# Patient Record
Sex: Female | Born: 1965
Health system: Southern US, Community
[De-identification: ages and names within clinical notes are randomized; demographics above are authoritative.]

## PROBLEM LIST (undated history)

## (undated) DIAGNOSIS — G5700 Lesion of sciatic nerve, unspecified lower limb: Secondary | ICD-10-CM

## (undated) DIAGNOSIS — M549 Dorsalgia, unspecified: Secondary | ICD-10-CM

## (undated) HISTORY — PX: ABDOMINAL HYSTERECTOMY: SHX81

## (undated) HISTORY — PX: HERNIA REPAIR: SHX51

---

## 2004-04-09 ENCOUNTER — Observation Stay (HOSPITAL_COMMUNITY): Admission: RE | Admit: 2004-04-09 | Discharge: 2004-04-10 | Payer: Self-pay | Admitting: Gynecology

## 2005-04-21 ENCOUNTER — Emergency Department: Payer: Self-pay | Admitting: Emergency Medicine

## 2006-12-05 ENCOUNTER — Ambulatory Visit: Payer: Self-pay | Admitting: Family Medicine

## 2007-07-19 ENCOUNTER — Ambulatory Visit: Payer: Self-pay | Admitting: Family Medicine

## 2011-02-27 ENCOUNTER — Emergency Department: Payer: Self-pay | Admitting: Emergency Medicine

## 2011-03-25 ENCOUNTER — Ambulatory Visit: Payer: Self-pay | Admitting: Orthopedic Surgery

## 2015-02-06 ENCOUNTER — Other Ambulatory Visit: Payer: Self-pay | Admitting: Family Medicine

## 2015-02-06 DIAGNOSIS — Z1239 Encounter for other screening for malignant neoplasm of breast: Secondary | ICD-10-CM

## 2015-02-13 ENCOUNTER — Ambulatory Visit
Admission: RE | Admit: 2015-02-13 | Discharge: 2015-02-13 | Disposition: A | Discharge status: Home / Self Care | Payer: Commercial Managed Care - PPO | Source: Ambulatory Visit | Attending: Family Medicine | Admitting: Family Medicine

## 2015-02-13 DIAGNOSIS — Z1231 Encounter for screening mammogram for malignant neoplasm of breast: Secondary | ICD-10-CM | POA: Insufficient documentation

## 2015-02-13 DIAGNOSIS — N6489 Other specified disorders of breast: Secondary | ICD-10-CM | POA: Insufficient documentation

## 2015-02-13 DIAGNOSIS — Z1239 Encounter for other screening for malignant neoplasm of breast: Secondary | ICD-10-CM

## 2015-02-19 ENCOUNTER — Other Ambulatory Visit: Payer: Self-pay | Admitting: Family Medicine

## 2015-02-19 DIAGNOSIS — N6489 Other specified disorders of breast: Secondary | ICD-10-CM

## 2015-02-19 DIAGNOSIS — R928 Other abnormal and inconclusive findings on diagnostic imaging of breast: Secondary | ICD-10-CM

## 2015-02-19 DIAGNOSIS — Z1231 Encounter for screening mammogram for malignant neoplasm of breast: Secondary | ICD-10-CM

## 2015-02-20 ENCOUNTER — Ambulatory Visit
Admission: RE | Admit: 2015-02-20 | Discharge: 2015-02-20 | Disposition: A | Discharge status: Home / Self Care | Payer: Commercial Managed Care - PPO | Source: Ambulatory Visit | Attending: Family Medicine | Admitting: Family Medicine

## 2015-02-20 DIAGNOSIS — N6001 Solitary cyst of right breast: Secondary | ICD-10-CM | POA: Insufficient documentation

## 2015-02-20 DIAGNOSIS — R928 Other abnormal and inconclusive findings on diagnostic imaging of breast: Secondary | ICD-10-CM

## 2015-02-20 DIAGNOSIS — N6489 Other specified disorders of breast: Secondary | ICD-10-CM

## 2015-06-25 ENCOUNTER — Other Ambulatory Visit: Payer: Self-pay | Admitting: Family Medicine

## 2015-07-06 ENCOUNTER — Other Ambulatory Visit: Payer: Self-pay | Admitting: Family Medicine

## 2015-07-06 DIAGNOSIS — Z1231 Encounter for screening mammogram for malignant neoplasm of breast: Secondary | ICD-10-CM

## 2015-07-10 ENCOUNTER — Other Ambulatory Visit: Payer: Self-pay | Admitting: Family Medicine

## 2015-07-21 ENCOUNTER — Other Ambulatory Visit: Payer: Self-pay | Admitting: Family Medicine

## 2015-07-21 DIAGNOSIS — R928 Other abnormal and inconclusive findings on diagnostic imaging of breast: Secondary | ICD-10-CM

## 2015-08-25 ENCOUNTER — Ambulatory Visit: Admission: RE | Admit: 2015-08-25 | Payer: Commercial Managed Care - PPO | Source: Ambulatory Visit

## 2015-08-25 ENCOUNTER — Other Ambulatory Visit: Payer: Commercial Managed Care - PPO

## 2015-09-09 ENCOUNTER — Ambulatory Visit
Admission: RE | Admit: 2015-09-09 | Discharge: 2015-09-09 | Disposition: A | Discharge status: Home / Self Care | Payer: Commercial Managed Care - PPO | Source: Ambulatory Visit | Attending: Family Medicine | Admitting: Family Medicine

## 2015-09-09 DIAGNOSIS — R928 Other abnormal and inconclusive findings on diagnostic imaging of breast: Secondary | ICD-10-CM | POA: Diagnosis not present

## 2015-09-09 DIAGNOSIS — N6489 Other specified disorders of breast: Secondary | ICD-10-CM | POA: Diagnosis present

## 2015-12-24 ENCOUNTER — Emergency Department: Payer: No Typology Code available for payment source

## 2015-12-24 ENCOUNTER — Encounter: Payer: Self-pay | Admitting: Emergency Medicine

## 2015-12-24 ENCOUNTER — Emergency Department
Admission: EM | Admit: 2015-12-24 | Discharge: 2015-12-24 | Disposition: A | Discharge status: Home / Self Care | Payer: No Typology Code available for payment source | Attending: Emergency Medicine | Admitting: Emergency Medicine

## 2015-12-24 DIAGNOSIS — S40021A Contusion of right upper arm, initial encounter: Secondary | ICD-10-CM | POA: Diagnosis not present

## 2015-12-24 DIAGNOSIS — N289 Disorder of kidney and ureter, unspecified: Secondary | ICD-10-CM | POA: Insufficient documentation

## 2015-12-24 DIAGNOSIS — S60511A Abrasion of right hand, initial encounter: Secondary | ICD-10-CM

## 2015-12-24 DIAGNOSIS — Y92488 Other paved roadways as the place of occurrence of the external cause: Secondary | ICD-10-CM | POA: Diagnosis not present

## 2015-12-24 DIAGNOSIS — Y999 Unspecified external cause status: Secondary | ICD-10-CM | POA: Diagnosis not present

## 2015-12-24 DIAGNOSIS — S4991XA Unspecified injury of right shoulder and upper arm, initial encounter: Secondary | ICD-10-CM | POA: Diagnosis present

## 2015-12-24 DIAGNOSIS — F17210 Nicotine dependence, cigarettes, uncomplicated: Secondary | ICD-10-CM | POA: Insufficient documentation

## 2015-12-24 DIAGNOSIS — Y9389 Activity, other specified: Secondary | ICD-10-CM | POA: Diagnosis not present

## 2015-12-24 HISTORY — DX: Lesion of sciatic nerve, unspecified lower limb: G57.00

## 2015-12-24 MED ORDER — ACETAMINOPHEN 500 MG PO TABS
1000.0000 mg | ORAL_TABLET | ORAL | Status: AC
  Administered 2015-12-24: 1000 mg via ORAL
  Filled 2015-12-24: qty 2

## 2015-12-24 MED ORDER — IBUPROFEN 600 MG PO TABS
600.0000 mg | ORAL_TABLET | ORAL | Status: AC
  Administered 2015-12-24: 600 mg via ORAL
  Filled 2015-12-24: qty 1
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
## 2016-02-05 ENCOUNTER — Other Ambulatory Visit: Payer: Self-pay | Admitting: Family Medicine

## 2016-02-05 DIAGNOSIS — R928 Other abnormal and inconclusive findings on diagnostic imaging of breast: Secondary | ICD-10-CM

## 2016-04-07 ENCOUNTER — Other Ambulatory Visit: Payer: Self-pay | Admitting: Family Medicine

## 2016-04-07 ENCOUNTER — Ambulatory Visit
Admission: RE | Admit: 2016-04-07 | Discharge: 2016-04-07 | Disposition: A | Discharge status: Home / Self Care | Payer: Commercial Managed Care - PPO | Source: Ambulatory Visit | Attending: Family Medicine | Admitting: Family Medicine

## 2016-04-07 DIAGNOSIS — R928 Other abnormal and inconclusive findings on diagnostic imaging of breast: Secondary | ICD-10-CM | POA: Diagnosis present

## 2016-04-12 ENCOUNTER — Ambulatory Visit
Admission: RE | Admit: 2016-04-12 | Discharge: 2016-04-12 | Disposition: A | Discharge status: Home / Self Care | Payer: Commercial Managed Care - PPO | Source: Ambulatory Visit | Attending: Family Medicine | Admitting: Family Medicine

## 2016-04-12 DIAGNOSIS — R928 Other abnormal and inconclusive findings on diagnostic imaging of breast: Secondary | ICD-10-CM | POA: Insufficient documentation

## 2016-06-27 ENCOUNTER — Other Ambulatory Visit: Payer: Self-pay | Admitting: Internal Medicine

## 2016-06-27 DIAGNOSIS — G8929 Other chronic pain: Secondary | ICD-10-CM

## 2016-06-27 DIAGNOSIS — M5440 Lumbago with sciatica, unspecified side: Principal | ICD-10-CM

## 2016-06-29 ENCOUNTER — Ambulatory Visit
Admission: RE | Admit: 2016-06-29 | Discharge: 2016-06-29 | Disposition: A | Discharge status: Home / Self Care | Payer: Commercial Managed Care - PPO | Source: Ambulatory Visit | Attending: Internal Medicine | Admitting: Internal Medicine

## 2016-06-29 DIAGNOSIS — M1288 Other specific arthropathies, not elsewhere classified, other specified site: Secondary | ICD-10-CM | POA: Insufficient documentation

## 2016-06-29 DIAGNOSIS — G8929 Other chronic pain: Secondary | ICD-10-CM

## 2016-06-29 DIAGNOSIS — M5126 Other intervertebral disc displacement, lumbar region: Secondary | ICD-10-CM | POA: Insufficient documentation

## 2016-06-29 DIAGNOSIS — M5440 Lumbago with sciatica, unspecified side: Secondary | ICD-10-CM | POA: Insufficient documentation

## 2017-02-23 DIAGNOSIS — M5432 Sciatica, left side: Secondary | ICD-10-CM | POA: Diagnosis not present

## 2017-03-20 DIAGNOSIS — M539 Dorsopathy, unspecified: Secondary | ICD-10-CM | POA: Diagnosis not present

## 2017-03-20 DIAGNOSIS — M25552 Pain in left hip: Secondary | ICD-10-CM | POA: Diagnosis not present

## 2017-05-04 DIAGNOSIS — R7303 Prediabetes: Secondary | ICD-10-CM | POA: Diagnosis not present

## 2017-05-04 DIAGNOSIS — N951 Menopausal and female climacteric states: Secondary | ICD-10-CM | POA: Diagnosis not present

## 2017-05-04 DIAGNOSIS — R5383 Other fatigue: Secondary | ICD-10-CM | POA: Diagnosis not present

## 2017-05-05 DIAGNOSIS — M5136 Other intervertebral disc degeneration, lumbar region: Secondary | ICD-10-CM | POA: Diagnosis not present

## 2017-05-05 DIAGNOSIS — M5416 Radiculopathy, lumbar region: Secondary | ICD-10-CM | POA: Diagnosis not present

## 2017-06-09 DIAGNOSIS — M5136 Other intervertebral disc degeneration, lumbar region: Secondary | ICD-10-CM | POA: Diagnosis not present

## 2017-06-09 DIAGNOSIS — M5416 Radiculopathy, lumbar region: Secondary | ICD-10-CM | POA: Diagnosis not present

## 2017-09-04 IMAGING — MR MR LUMBAR SPINE W/O CM
4 of 5 series · 15 of 48 positions shown · non-contrast
Comparison: None.

CLINICAL DATA: Low back pain. Numbness and tingling in LEFT leg.
Previous lifting injury.

EXAM:
MRI LUMBAR SPINE WITHOUT CONTRAST
TECHNIQUE: Multiplanar, multisequence MR imaging of the lumbar spine was
performed. No intravenous contrast was administered.

[Series 3: T2 · sagittal · 4.0mm · 0.44mm/px · 6 of 15 slices shown (1 of 2)]
[im 1/15]
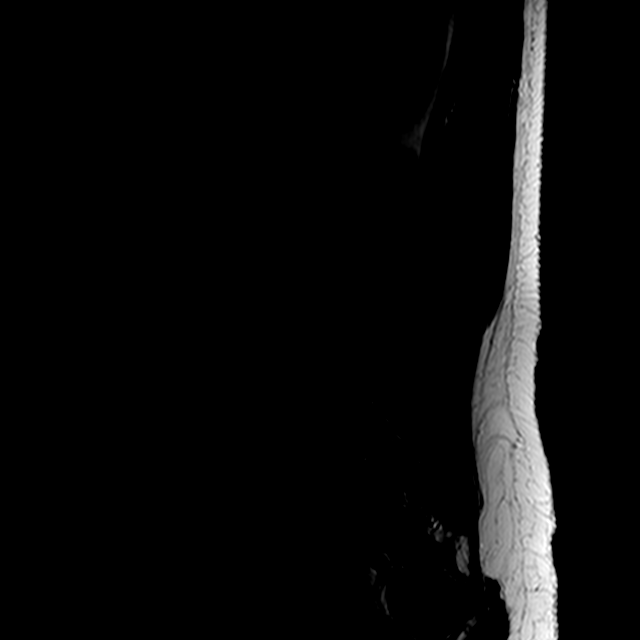
[im 3/15]
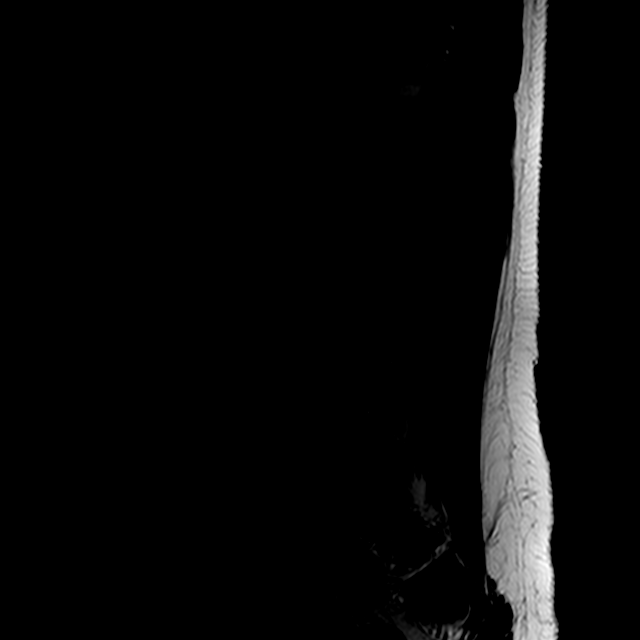
[im 6/15]
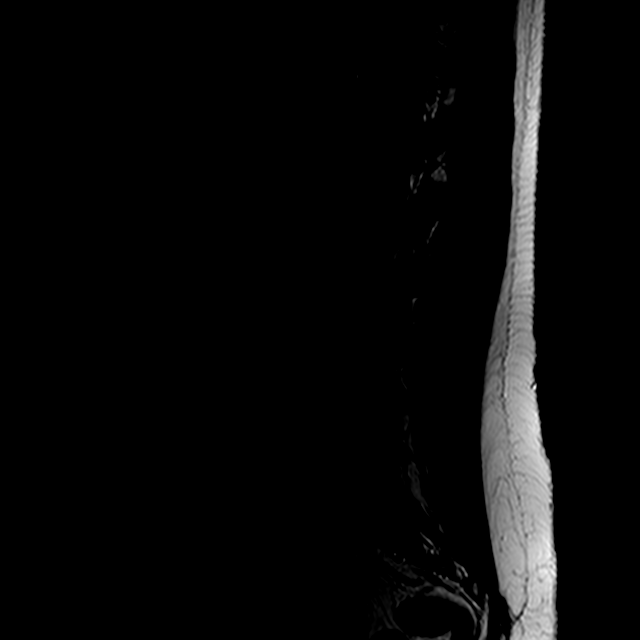
[im 9/15]
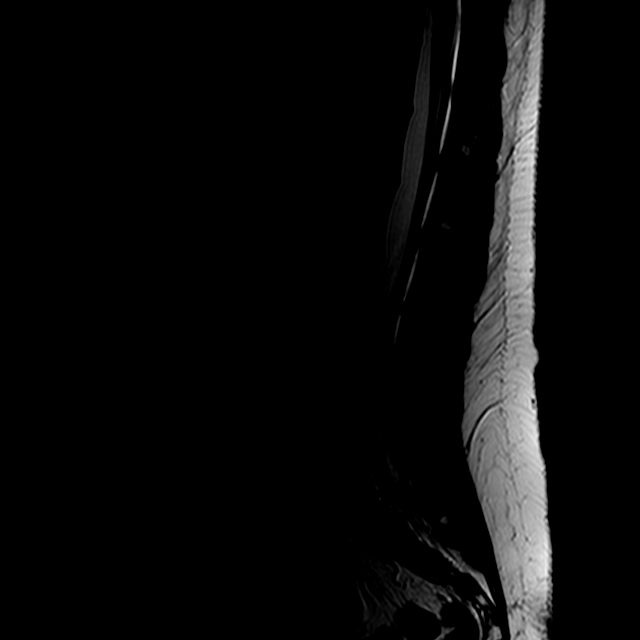
[im 12/15]
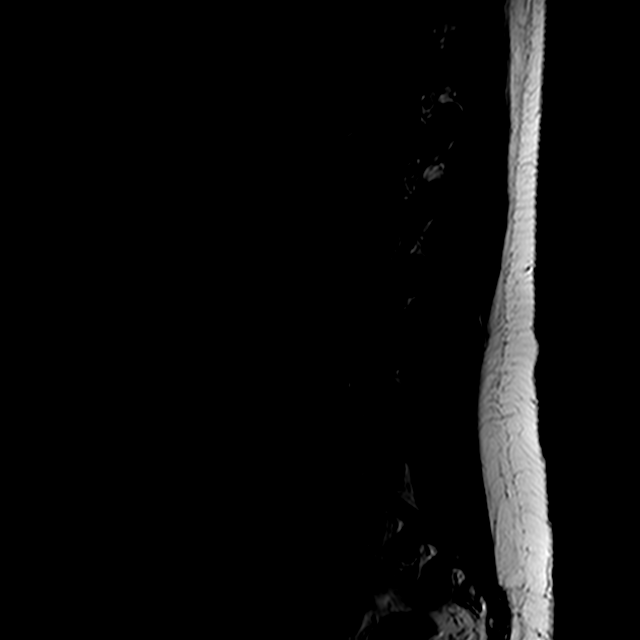
[im 15/15]
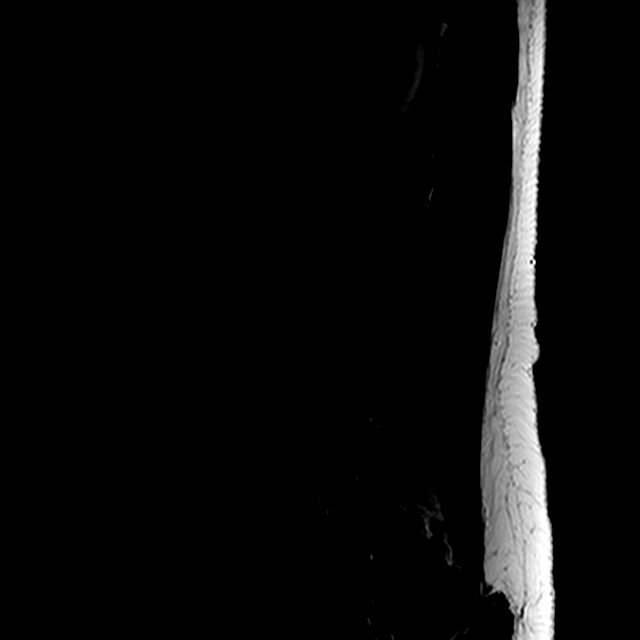

[Series 4: T1 · sagittal · 4.0mm · 0.44mm/px · 3 of 15 slices shown (1 of 2)]
[im 3/15]
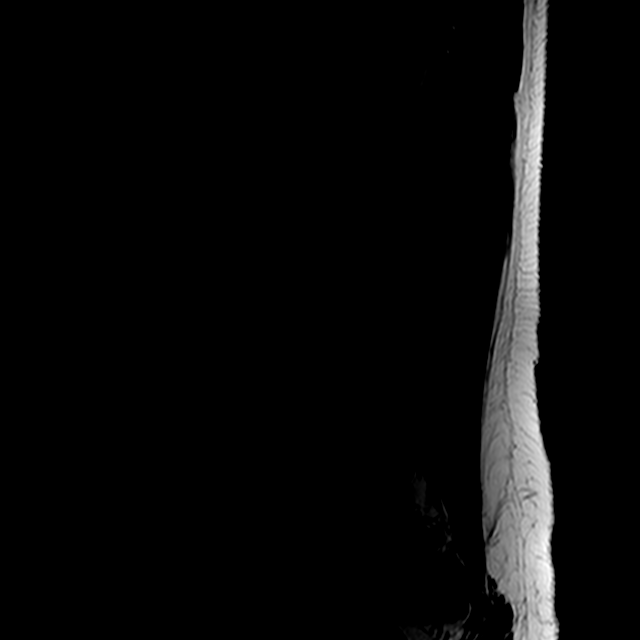
[im 8/15]
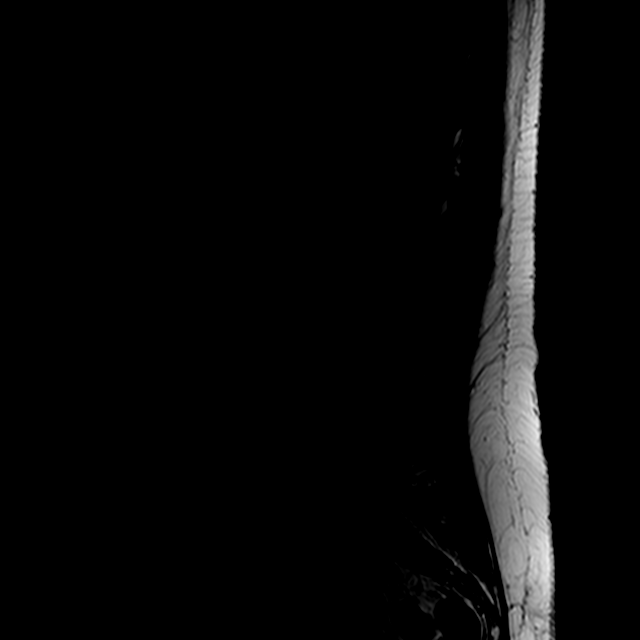
[im 12/15]
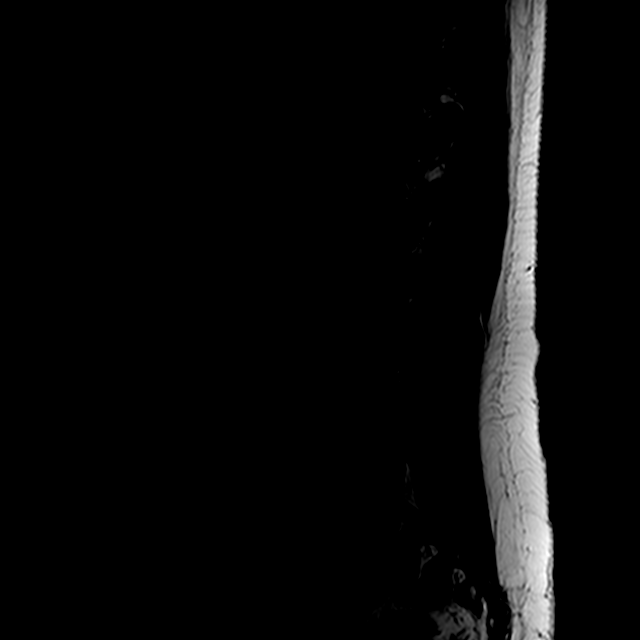

[Series 6: T2 · axial · 4.0mm · 0.39mm/px · z∈[-75,+61]mm · 3 of 31 slices shown (2 of 2)]
[im 5/31]
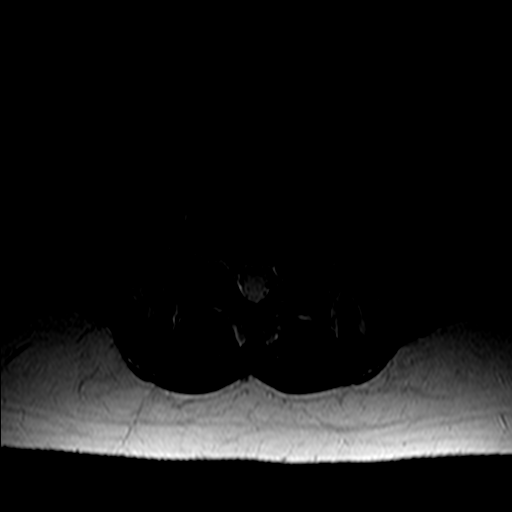
[im 17/31]
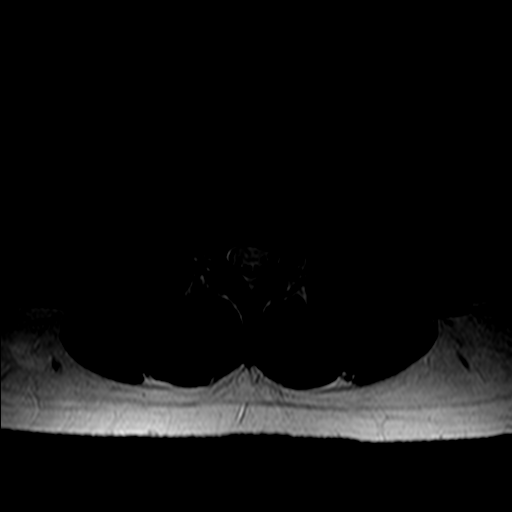
[im 26/31]
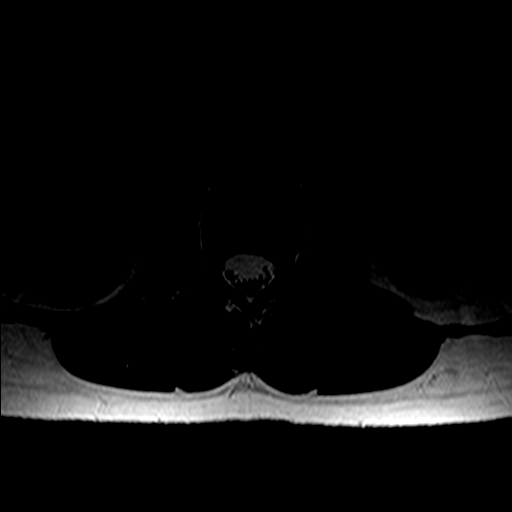

[Series 7: T1 · axial · 4.0mm · 0.39mm/px · z∈[-75,+61]mm · 3 of 31 slices shown (2 of 2)]
[im 5/31]
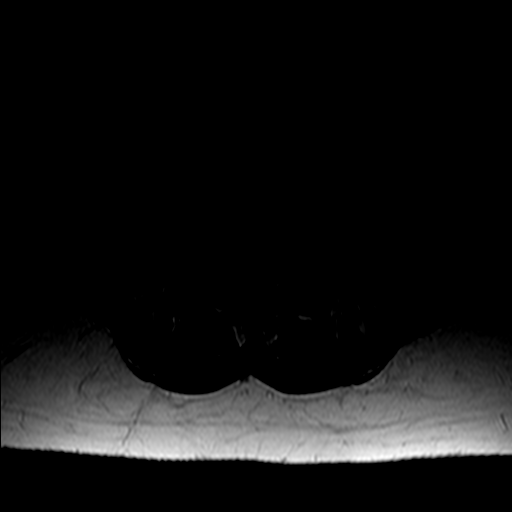
[im 17/31]
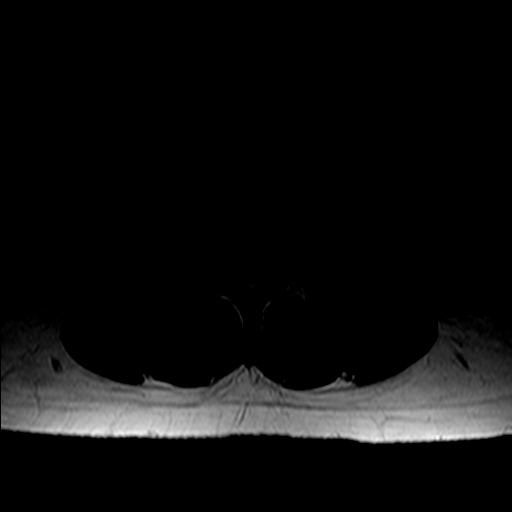
[im 26/31]
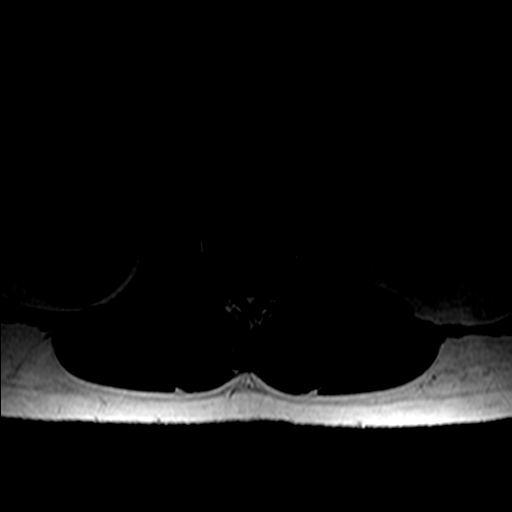

[15 of 48 positions shown; findings below may reference images not displayed]

FINDINGS: Segmentation:  Standard.

Alignment:  Physiologic.

Vertebrae: No fracture or evidence of discitis. No focal bone
lesion. Low signal intensity T1 and T2 marrow signal correlates with
smoking.

Conus medullaris: Extends to the L1 level and appears normal.

Paraspinal and other soft tissues: Negative

Disc levels:

L1-L2:  Normal.

L2-L3: Far-lateral) protrusion, with annular rent. Mild facet
arthropathy. LEFT L2 nerve root impingement.

L3-L4: Far lateral and foraminal protrusion on the LEFT. Mild facet
arthropathy. LEFT L3 nerve root impingement.

L4-L5:  Annular bulge.  Facet arthropathy.  No impingement.

L5-S1:  Normal.

Compared with priors, disc pathology at L2-3 and L3-4 is new.
IMPRESSION: Far lateral and foraminal protrusion at L2-3 and L3-4, both on the
LEFT. These could contribute to the patient's symptomatology. Both
appear new from 1661.

No significant spinal stenosis or conus compression.

Low signal intensity bone marrow correlates with smoking.

## 2018-05-03 DIAGNOSIS — H5213 Myopia, bilateral: Secondary | ICD-10-CM | POA: Diagnosis not present

## 2018-05-26 ENCOUNTER — Encounter: Payer: Self-pay | Admitting: Emergency Medicine

## 2018-05-26 ENCOUNTER — Emergency Department
Admission: EM | Admit: 2018-05-26 | Discharge: 2018-05-26 | Disposition: A | Discharge status: Home / Self Care | Payer: 59 | Attending: Emergency Medicine | Admitting: Emergency Medicine

## 2018-05-26 DIAGNOSIS — R21 Rash and other nonspecific skin eruption: Secondary | ICD-10-CM | POA: Insufficient documentation

## 2018-05-26 DIAGNOSIS — F17210 Nicotine dependence, cigarettes, uncomplicated: Secondary | ICD-10-CM | POA: Diagnosis not present

## 2018-05-26 MED ORDER — TRIAMCINOLONE ACETONIDE 0.5 % EX OINT: 1.0000 "application " | TOPICAL_OINTMENT | Freq: Two times a day (BID) | CUTANEOUS | 0 refills | Status: AC

## 2018-05-26 NOTE — ED Triage Notes (Signed)
Pt arrived with concerns over rash to left arm/hand. Small red raised bumps visualized.

## 2018-05-26 NOTE — Discharge Instructions (Signed)
Follow-up with your primary care provider if any continued problems.  Begin using the ointment that was prescribed for you.  Follow-up with the dermatologist listed on your discharge papers if any worsening of your symptoms.

## 2018-05-26 NOTE — ED Provider Notes (Signed)
[facility] Emergency Department Provider Note  ____________________________________________   None    (approximate)  I have reviewed the triage vital signs and the nursing notes.   HISTORY  Chief Complaint Rash   HPI Tammie Mccoy is a 52 y.o. female presents to the ED with complaint of rash that she noticed at lunchtime.  She has several small red papules to the upper extremities and limited below the elbow bilaterally.  Areas are not itching.  There is been no warmth or drainage from the areas.  Patient denies any other areas involved on her body.  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  Past Medical History:  Diagnosis Date  . Sciatic neuropathy     There are no active problems to display for this patient.   History reviewed. No pertinent surgical history.  Prior to Admission medications   Medication Sig Start Date End Date Taking? Authorizing Provider  triamcinolone ointment (KENALOG) 0.5 % Apply 1 application topically 2 (two) times daily. 05/26/18   Tommi Rumps, PA-C    Allergies Patient has no known allergies.  Family History  Problem Relation Age of Onset  . Breast cancer Neg Hx     Social History Social History   Tobacco Use  . Smoking status: Current Every Day Smoker    Packs/day: 0.25    Types: Cigarettes  Substance Use Topics  . Alcohol use: Yes  . Drug use: Not on file    Review of Systems Constitutional: No fever/chills Cardiovascular: Denies chest pain. Respiratory: Denies shortness of breath. Musculoskeletal: Negative for back pain. Skin: Positive for rash. Neurological: Negative for headaches, focal weakness or numbness. ___________________________________________   PHYSICAL EXAM:  VITAL SIGNS: ED Triage Vitals  Enc Vitals Group     BP 05/26/18 1522 138/81     Pulse Rate 05/26/18 1522 86     Resp 05/26/18 1522 16     Temp 05/26/18 1522 98.3 F  (36.8 C)     Temp Source 05/26/18 1522 Oral     SpO2 05/26/18 1522 99 %     Weight 05/26/18 1523 138 lb (62.6 kg)     Height 05/26/18 1523 5\' 2"  (1.575 m)     Head Circumference --      Peak Flow --      Pain Score 05/26/18 1523 0     Pain Loc --      Pain Edu? --      Excl. in GC? --    Constitutional: Alert and oriented. Well appearing and in no acute distress. Eyes: Conjunctivae are normal.  Head: Atraumatic. Neck: No stridor.   Cardiovascular: Normal rate, regular rhythm. Grossly normal heart sounds.  Good peripheral circulation. Respiratory: Normal respiratory effort.  No retractions. Lungs CTAB. Musculoskeletal: His upper and lower extremities that any difficulty and normal gait was noted. Neurologic:  Normal speech and language. No gross focal neurologic deficits are appreciated.  Skin:  Skin is warm, dry.  There are 3-4 individual red papular lesions noted to each forearm without any drainage or pain to palpation.  One single area is noted midforearm.  No other rash or similar lesions is noted to the torso or lower extremities. Psychiatric: Mood and affect are normal. Speech and behavior are normal.  ____________________________________________   LABS (all labs ordered are listed, but only abnormal results are displayed)  Labs Reviewed - No data to display  PROCEDURES  Procedure(s) performed:  None  Procedures  Critical Care performed: No  ____________________________________________   INITIAL IMPRESSION / ASSESSMENT AND PLAN / ED COURSE  As part of my medical decision making, I reviewed the following data within the electronic MEDICAL RECORD NUMBER Notes from prior ED visits and [place] Controlled Substance Database  Patient presents to the ED with complaint of rash that began while she was at work.  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  She denies any itching to the area.  She has not placed any over-the-counter medication to it.  These appear to be  individual areas which could be insect bites.  Patient was given Kenalog cream to apply to the area twice daily.  She is to follow-up with her PCP if any continued problems or aloe up with [facility] if any worsening or continued problems. ____________________________________________   FINAL CLINICAL IMPRESSION(S) / ED DIAGNOSES  Final diagnoses:  Rash and nonspecific skin eruption     ED Discharge Orders         Ordered    triamcinolone ointment (KENALOG) 0.5 %  2 times daily     05/26/18 1625           Note:  This document was prepared using Dragon voice recognition software and may include unintentional dictation errors.    Tommi Rumps, PA-C 05/26/18 1702    Phineas Semen, MD 05/26/18 1816

## 2018-12-24 DIAGNOSIS — M65332 Trigger finger, left middle finger: Secondary | ICD-10-CM | POA: Diagnosis not present

## 2019-05-14 MED FILL — GABAPENTIN 300 MG CAPSULE: 300 | 30 days supply | Qty: 60 | Fill #0

## 2019-05-17 DIAGNOSIS — Z1231 Encounter for screening mammogram for malignant neoplasm of breast: Secondary | ICD-10-CM | POA: Diagnosis not present

## 2019-05-17 DIAGNOSIS — Z113 Encounter for screening for infections with a predominantly sexual mode of transmission: Secondary | ICD-10-CM | POA: Diagnosis not present

## 2019-05-17 DIAGNOSIS — Z1211 Encounter for screening for malignant neoplasm of colon: Secondary | ICD-10-CM | POA: Diagnosis not present

## 2019-05-17 DIAGNOSIS — R7303 Prediabetes: Secondary | ICD-10-CM | POA: Diagnosis not present

## 2019-05-17 DIAGNOSIS — Z1389 Encounter for screening for other disorder: Secondary | ICD-10-CM | POA: Diagnosis not present

## 2019-05-17 DIAGNOSIS — F17210 Nicotine dependence, cigarettes, uncomplicated: Secondary | ICD-10-CM | POA: Diagnosis not present

## 2019-05-28 ENCOUNTER — Other Ambulatory Visit (HOSPITAL_COMMUNITY): Payer: Self-pay | Admitting: Family Medicine

## 2019-05-28 DIAGNOSIS — IMO0002 Reserved for concepts with insufficient information to code with codable children: Secondary | ICD-10-CM

## 2020-04-09 DIAGNOSIS — M5442 Lumbago with sciatica, left side: Secondary | ICD-10-CM | POA: Diagnosis not present

## 2020-04-09 DIAGNOSIS — R928 Other abnormal and inconclusive findings on diagnostic imaging of breast: Secondary | ICD-10-CM | POA: Diagnosis not present

## 2020-04-09 DIAGNOSIS — E785 Hyperlipidemia, unspecified: Secondary | ICD-10-CM | POA: Diagnosis not present

## 2020-04-09 DIAGNOSIS — R7303 Prediabetes: Secondary | ICD-10-CM | POA: Diagnosis not present

## 2020-04-09 DIAGNOSIS — F4320 Adjustment disorder, unspecified: Secondary | ICD-10-CM | POA: Diagnosis not present

## 2020-04-23 ENCOUNTER — Telehealth (INDEPENDENT_AMBULATORY_CARE_PROVIDER_SITE_OTHER): Payer: Self-pay | Admitting: Gastroenterology

## 2020-04-23 ENCOUNTER — Other Ambulatory Visit: Payer: Self-pay

## 2020-04-23 DIAGNOSIS — Z1211 Encounter for screening for malignant neoplasm of colon: Secondary | ICD-10-CM

## 2020-04-23 MED ORDER — NA SULFATE-K SULFATE-MG SULF 17.5-3.13-1.6 GM/177ML PO SOLN: 1.0000 | Freq: Once | ORAL | 0 refills | Status: AC

## 2020-04-23 NOTE — Progress Notes (Signed)
Gastroenterology Pre-Procedure Review  Request Date: Friday 05/01/20 Requesting Physician: Dr. Tobi Bastos  PATIENT REVIEW QUESTIONS: The patient responded to the following health history questions as indicated:    1. Are you having any GI issues? no 2. Do you have a personal history of Polyps? no 3. Do you have a family history of Colon Cancer or Polyps? no 4. Diabetes Mellitus? no 5. Joint replacements in the past 12 months?no 6. Major health problems in the past 3 months?no 7. Any artificial heart valves, MVP, or defibrillator?no    MEDICATIONS & ALLERGIES:    Patient reports the following regarding taking any anticoagulation/antiplatelet therapy:   Plavix, Coumadin, Eliquis, Xarelto, Lovenox, Pradaxa, Brilinta, or Effient? no Aspirin? no  Patient confirms/reports the following medications:  Current Outpatient Medications  Medication Sig Dispense Refill  . gabapentin (NEURONTIN) 300 MG capsule Take by mouth.    Marland Kitchen ibuprofen (ADVIL) 200 MG tablet Take by mouth.    . meloxicam (MOBIC) 15 MG tablet Take 15 mg by mouth daily as needed.    . mirtazapine (REMERON) 15 MG tablet Take by mouth.    . triamcinolone ointment (KENALOG) 0.5 % Apply 1 application topically 2 (two) times daily. 15 g 0   No current facility-administered medications for this visit.    Patient confirms/reports the following allergies:  No Known Allergies  No orders of the defined types were placed in this encounter.   AUTHORIZATION INFORMATION Primary Insurance: 1D#: Group #:  Secondary Insurance: 1D#: Group #:  SCHEDULE INFORMATION: Date: Friday 05/01/20 Time: Location:[facility]

## 2020-04-29 ENCOUNTER — Other Ambulatory Visit
Admission: RE | Admit: 2020-04-29 | Discharge: 2020-04-29 | Disposition: A | Discharge status: Home / Self Care | Payer: 59 | Source: Ambulatory Visit | Attending: Gastroenterology | Admitting: Gastroenterology

## 2020-04-29 ENCOUNTER — Other Ambulatory Visit: Payer: Self-pay

## 2020-04-29 DIAGNOSIS — Z01812 Encounter for preprocedural laboratory examination: Secondary | ICD-10-CM | POA: Insufficient documentation

## 2020-04-29 DIAGNOSIS — Z20822 Contact with and (suspected) exposure to covid-19: Secondary | ICD-10-CM | POA: Diagnosis not present

## 2020-04-29 LAB — SARS CORONAVIRUS 2 (TAT 6-24 HRS): SARS Coronavirus 2: NEGATIVE

## 2020-05-01 ENCOUNTER — Ambulatory Visit: Payer: 59 | Admitting: Certified Registered"

## 2020-05-01 ENCOUNTER — Encounter
Admission: RE | Disposition: A | Discharge status: Home / Self Care | Payer: Self-pay | Source: Ambulatory Visit | Attending: Gastroenterology

## 2020-05-01 ENCOUNTER — Ambulatory Visit
Admission: RE | Admit: 2020-05-01 | Discharge: 2020-05-01 | Disposition: A | Discharge status: Home / Self Care | Payer: 59 | Source: Ambulatory Visit | Attending: Gastroenterology | Admitting: Gastroenterology

## 2020-05-01 ENCOUNTER — Encounter: Payer: Self-pay | Admitting: Gastroenterology

## 2020-05-01 DIAGNOSIS — K621 Rectal polyp: Secondary | ICD-10-CM | POA: Insufficient documentation

## 2020-05-01 DIAGNOSIS — Z791 Long term (current) use of non-steroidal anti-inflammatories (NSAID): Secondary | ICD-10-CM | POA: Insufficient documentation

## 2020-05-01 DIAGNOSIS — K5730 Diverticulosis of large intestine without perforation or abscess without bleeding: Secondary | ICD-10-CM | POA: Insufficient documentation

## 2020-05-01 DIAGNOSIS — F17210 Nicotine dependence, cigarettes, uncomplicated: Secondary | ICD-10-CM | POA: Insufficient documentation

## 2020-05-01 DIAGNOSIS — Z7689 Persons encountering health services in other specified circumstances: Secondary | ICD-10-CM | POA: Diagnosis not present

## 2020-05-01 DIAGNOSIS — K5140 Inflammatory polyps of colon without complications: Secondary | ICD-10-CM | POA: Diagnosis not present

## 2020-05-01 DIAGNOSIS — K5790 Diverticulosis of intestine, part unspecified, without perforation or abscess without bleeding: Secondary | ICD-10-CM | POA: Diagnosis not present

## 2020-05-01 DIAGNOSIS — Z1211 Encounter for screening for malignant neoplasm of colon: Secondary | ICD-10-CM | POA: Diagnosis not present

## 2020-05-01 DIAGNOSIS — Z79899 Other long term (current) drug therapy: Secondary | ICD-10-CM | POA: Diagnosis not present

## 2020-05-01 DIAGNOSIS — K635 Polyp of colon: Secondary | ICD-10-CM | POA: Diagnosis not present

## 2020-05-01 HISTORY — PX: COLONOSCOPY WITH PROPOFOL: SHX5780

## 2020-05-01 HISTORY — DX: Dorsalgia, unspecified: M54.9

## 2020-05-01 SURGERY — COLONOSCOPY WITH PROPOFOL
Anesthesia: General

## 2020-05-01 MED ORDER — PROPOFOL 500 MG/50ML IV EMUL
INTRAVENOUS | Status: DC | PRN
  Administered 2020-05-01: 165 ug/kg/min via INTRAVENOUS

## 2020-05-01 MED ORDER — SODIUM CHLORIDE 0.9 % IV SOLN: INTRAVENOUS | Status: DC

## 2020-05-01 MED ORDER — PROPOFOL 10 MG/ML IV BOLUS
INTRAVENOUS | Status: DC | PRN
  Administered 2020-05-01: 10 mg via INTRAVENOUS
  Administered 2020-05-01: 55 mg via INTRAVENOUS

## 2020-05-01 MED ORDER — LIDOCAINE HCL (CARDIAC) PF 100 MG/5ML IV SOSY
PREFILLED_SYRINGE | INTRAVENOUS | Status: DC | PRN
  Administered 2020-05-01: 100 mg via INTRAVENOUS

## 2020-05-01 NOTE — Anesthesia Procedure Notes (Signed)
Procedure Name: General with mask airway Performed by: Fletcher-Harrison, Elfie Costanza, CRNA Pre-anesthesia Checklist: Patient identified, Emergency Drugs available, Suction available and Patient being monitored Patient Re-evaluated:Patient Re-evaluated prior to induction Oxygen Delivery Method: Simple face mask Induction Type: IV induction Placement Confirmation: positive ETCO2 and CO2 detector Dental Injury: Teeth and Oropharynx as per pre-operative assessment        

## 2020-05-01 NOTE — Anesthesia Postprocedure Evaluation (Signed)
Anesthesia Post Note  Patient: Tammie Mccoy  Procedure(s) Performed: COLONOSCOPY WITH PROPOFOL (N/A )  Patient location during evaluation: Endoscopy Anesthesia Type: General Level of consciousness: awake and alert Pain management: pain level controlled Vital Signs Assessment: post-procedure vital signs reviewed and stable Respiratory status: spontaneous breathing and respiratory function stable Cardiovascular status: stable Anesthetic complications: no   No complications documented.   Last Vitals:  Vitals:   05/01/20 0950 05/01/20 0953  BP: 126/88 126/88  Pulse: 98 94  Resp: (!) 22 20  Temp:    SpO2: 99% 100%    Last Pain:  Vitals:   05/01/20 0902  TempSrc: Temporal                 Rewa Weissberg K

## 2020-05-01 NOTE — Anesthesia Preprocedure Evaluation (Signed)
Anesthesia Evaluation  Patient identified by MRN, date of birth, ID band Patient awake    Reviewed: Allergy & Precautions, NPO status , Patient's Chart, lab work & pertinent test results  History of Anesthesia Complications Negative for: history of anesthetic complications  Airway Mallampati: II       Dental   Pulmonary neg sleep apnea, neg COPD, Current Smoker and Patient abstained from smoking.,           Cardiovascular (-) hypertension(-) Past MI and (-) CHF (-) dysrhythmias (-) Valvular Problems/Murmurs     Neuro/Psych neg Seizures    GI/Hepatic Neg liver ROS, neg GERD  ,  Endo/Other  neg diabetes  Renal/GU negative Renal ROS     Musculoskeletal   Abdominal   Peds  Hematology   Anesthesia Other Findings   Reproductive/Obstetrics                             Anesthesia Physical Anesthesia Plan  ASA: II  Anesthesia Plan: General   Post-op Pain Management:    Induction: Intravenous  PONV Risk Score and Plan: 2 and Propofol infusion and TIVA  Airway Management Planned: Nasal Cannula  Additional Equipment:   Intra-op Plan:   Post-operative Plan:   Informed Consent: I have reviewed the patients History and Physical, chart, labs and discussed the procedure including the risks, benefits and alternatives for the proposed anesthesia with the patient or authorized representative who has indicated his/her understanding and acceptance.       Plan Discussed with:   Anesthesia Plan Comments:         Anesthesia Quick Evaluation

## 2020-05-01 NOTE — H&P (Signed)
Wyline Mood, MD [address], [place], [place], [address] [address], [place], [place], [address] Phone: [phone]  Fax: [phone]  Primary Care Physician:  [facility]   Pre-Procedure History & Physical: HPI:  Tammie Mccoy is a 54 y.o. female is here for an colonoscopy.   Past Medical History:  Diagnosis Date  . Sciatic neuropathy     No past surgical history on file.  Prior to Admission medications   Medication Sig Start Date End Date Taking? Authorizing Provider  gabapentin (NEURONTIN) 300 MG capsule Take by mouth. 02/07/20   [provider]  ibuprofen (ADVIL) 200 MG tablet Take by mouth.    [provider]  meloxicam (MOBIC) 15 MG tablet Take 15 mg by mouth daily as needed. 04/09/20   [provider]  mirtazapine (REMERON) 15 MG tablet Take by mouth. 04/10/20   [provider]  triamcinolone ointment (KENALOG) 0.5 % Apply 1 application topically 2 (two) times daily. 05/26/18   Tommi Rumps, PA-C    Allergies as of 04/23/2020  . (No Known Allergies)    Family History  Problem Relation Age of Onset  . Breast cancer Neg Hx     Social History   Socioeconomic History  . Marital status: Married    Spouse name: Not on file  . Number of children: Not on file  . Years of education: Not on file  . Highest education level: Not on file  Occupational History  . Not on file  Tobacco Use  . Smoking status: Current Every Day Smoker    Packs/day: 0.25    Types: Cigarettes  Substance and Sexual Activity  . Alcohol use: Yes  . Drug use: Not on file  . Sexual activity: Not on file  Other Topics Concern  . Not on file  Social History Narrative  . Not on file   Social Determinants of Health   Financial Resource Strain:   . Difficulty of Paying Living Expenses: Not on file  Food Insecurity:   . Worried About Programme researcher, broadcasting/film/video in the Last Year: Not on file  . Ran Out of Food  in the Last Year: Not on file  Transportation Needs:   . Lack of Transportation (Medical): Not on file  . Lack of Transportation (Non-Medical): Not on file  Physical Activity:   . Days of Exercise per Week: Not on file  . Minutes of Exercise per Session: Not on file  Stress:   . Feeling of Stress : Not on file  Social Connections:   . Frequency of Communication with Friends and Family: Not on file  . Frequency of Social Gatherings with Friends and Family: Not on file  . Attends Religious Services: Not on file  . Active Member of Clubs or Organizations: Not on file  . Attends Banker Meetings: Not on file  . Marital Status: Not on file  Intimate Partner Violence:   . Fear of Current or Ex-Partner: Not on file  . Emotionally Abused: Not on file  . Physically Abused: Not on file  . Sexually Abused: Not on file    Review of Systems: See HPI, otherwise negative ROS  Physical Exam: There were no vitals taken for this visit. General:   Alert,  pleasant and cooperative in NAD Head:  Normocephalic and atraumatic. Neck:  Supple; no masses or thyromegaly. Lungs:  Clear throughout to auscultation, normal respiratory effort.    Heart:  +  S1, +S2, Regular rate and rhythm, No edema. Abdomen:  Soft, nontender and nondistended. Normal bowel sounds, without guarding, and without rebound.   Neurologic:  Alert and  oriented x4;  grossly normal neurologically.  Impression/Plan: Tammie Mccoy is here for an colonoscopy to be performed for Screening colonoscopy average risk   Risks, benefits, limitations, and alternatives regarding  colonoscopy have been reviewed with the patient.  Questions have been answered.  All parties agreeable.   Wyline Mood, MD  05/01/2020, 8:57 AM

## 2020-05-01 NOTE — Op Note (Signed)
[facility] Gastroenterology Patient Name: Tammie Mccoy Procedure Date: 05/01/2020 9:19 AM MRN: [id number] Account #: [id number] Date of Birth: [birth year] Admit Type: Outpatient Age: 54 Room: [facility] ENDO ROOM 4 Gender: Female Note Status: Finalized Procedure:             Colonoscopy Indications:           Screening for colorectal malignant neoplasm Providers:             Wyline Mood MD, MD Referring MD:          No Local Md, MD (Referring MD) Medicines:             Monitored Anesthesia Care Complications:         No immediate complications. Procedure:             Pre-Anesthesia Assessment:                        - Prior to the procedure, a History and Physical was                         performed, and patient medications, allergies and                         sensitivities were reviewed. The patient's tolerance                         of previous anesthesia was reviewed.                        - The risks and benefits of the procedure and the                         sedation options and risks were discussed with the                         patient. All questions were answered and informed                         consent was obtained.                        - ASA Grade Assessment: II - A patient with mild                         systemic disease.                        After obtaining informed consent, the colonoscope was                         passed under direct vision. Throughout the procedure,                         the patient's blood pressure, pulse, and oxygen                         saturations were monitored continuously. The                         Colonoscope was introduced through the anus  and                         advanced to the the cecum, identified by the                         appendiceal orifice. The colonoscopy was performed                         with ease. The patient tolerated the procedure well.                         The quality of  the bowel preparation was excellent. Findings:      The perianal and digital rectal examinations were normal.      Multiple small-mouthed diverticula were found in the sigmoid colon.      Two sessile polyps were found in the rectum and proximal descending       colon. The polyps were 5 to 7 mm in size. These polyps were removed with       a cold snare. Resection and retrieval were complete.      The exam was otherwise without abnormality on direct and retroflexion       views. Impression:            - Diverticulosis in the sigmoid colon.                        - Two 5 to 7 mm polyps in the rectum and in the                         proximal descending colon, removed with a cold snare.                         Resected and retrieved.                        - The examination was otherwise normal on direct and                         retroflexion views. Recommendation:        - Discharge patient to home (with escort).                        - Resume previous diet.                        - Continue present medications.                        - Await pathology results.                        - Repeat colonoscopy for surveillance based on                         pathology results. Procedure Code(s):     --- Professional ---                        [phone], Colonoscopy, flexible; with removal of  tumor(s), polyp(s), or other lesion(s) by snare                         technique Diagnosis Code(s):     --- Professional ---                        Z12.11, Encounter for screening for malignant neoplasm                         of colon                        K62.1, Rectal polyp                        K63.5, Polyp of colon                        K57.30, Diverticulosis of large intestine without                         perforation or abscess without bleeding CPT copyright 2019 American Medical Association. All rights reserved. The codes documented in this report are preliminary and  upon coder review may  be revised to meet current compliance requirements. Wyline Mood, MD Wyline Mood MD, MD 05/01/2020 9:42:01 AM This report has been signed electronically. Number of Addenda: 0 Note Initiated On: 05/01/2020 9:19 AM Scope Withdrawal Time: 0 hours 10 minutes 46 seconds  Total Procedure Duration: 0 hours 15 minutes 7 seconds  Estimated Blood Loss:  Estimated blood loss: none.      [facility]

## 2020-05-01 NOTE — Transfer of Care (Signed)
Immediate Anesthesia Transfer of Care Note  Patient: Tammie Mccoy  Procedure(s) Performed: COLONOSCOPY WITH PROPOFOL (N/A )  Patient Location: Endoscopy Unit  Anesthesia Type:General  Level of Consciousness: drowsy and responds to stimulation  Airway & Oxygen Therapy: Patient Spontanous Breathing  Post-op Assessment: Report given to RN and Post -op Vital signs reviewed and stable  Post vital signs: Reviewed and stable  Last Vitals:  Vitals Value Taken Time  BP 111/72 05/01/20 0941  Temp    Pulse 106 05/01/20 0941  Resp 20 05/01/20 0941  SpO2 100 % 05/01/20 0941  Vitals shown include unvalidated device data.  Last Pain:  Vitals:   05/01/20 0902  TempSrc: Temporal         Complications: No complications documented.

## 2020-05-03 ENCOUNTER — Encounter: Payer: Self-pay | Admitting: Gastroenterology

## 2020-05-05 ENCOUNTER — Encounter: Payer: Self-pay | Admitting: Gastroenterology

## 2020-05-05 LAB — SURGICAL PATHOLOGY

## 2020-05-21 DIAGNOSIS — R7303 Prediabetes: Secondary | ICD-10-CM | POA: Diagnosis not present

## 2020-05-21 DIAGNOSIS — R202 Paresthesia of skin: Secondary | ICD-10-CM | POA: Diagnosis not present

## 2020-05-21 DIAGNOSIS — E785 Hyperlipidemia, unspecified: Secondary | ICD-10-CM | POA: Diagnosis not present

## 2020-05-21 DIAGNOSIS — Z1389 Encounter for screening for other disorder: Secondary | ICD-10-CM | POA: Diagnosis not present

## 2020-05-21 DIAGNOSIS — Z23 Encounter for immunization: Secondary | ICD-10-CM | POA: Diagnosis not present

## 2020-05-29 ENCOUNTER — Other Ambulatory Visit: Payer: Self-pay | Admitting: Family Medicine

## 2020-07-12 DIAGNOSIS — M109 Gout, unspecified: Secondary | ICD-10-CM | POA: Diagnosis not present

## 2020-07-12 DIAGNOSIS — M7989 Other specified soft tissue disorders: Secondary | ICD-10-CM | POA: Diagnosis not present

## 2020-07-12 DIAGNOSIS — M79645 Pain in left finger(s): Secondary | ICD-10-CM | POA: Diagnosis not present

## 2020-11-11 ENCOUNTER — Other Ambulatory Visit: Payer: Self-pay | Admitting: Student in an Organized Health Care Education/Training Program

## 2021-01-11 ENCOUNTER — Other Ambulatory Visit: Payer: Self-pay

## 2021-01-14 ENCOUNTER — Other Ambulatory Visit: Payer: Self-pay

## 2021-01-14 MED ORDER — GABAPENTIN 300 MG PO CAPS
ORAL_CAPSULE | ORAL | 2 refills | Status: AC
  Filled 2021-01-14: qty 120, 60d supply, fill #0
  Filled 2021-04-02: qty 120, 60d supply, fill #1
  Filled 2021-06-16: qty 120, 60d supply, fill #2

## 2021-04-02 ENCOUNTER — Other Ambulatory Visit: Payer: Self-pay

## 2021-06-16 ENCOUNTER — Other Ambulatory Visit: Payer: Self-pay

## 2021-08-31 ENCOUNTER — Other Ambulatory Visit: Payer: Self-pay

## 2021-09-01 ENCOUNTER — Other Ambulatory Visit: Payer: Self-pay

## 2021-09-08 ENCOUNTER — Other Ambulatory Visit: Payer: Self-pay

## 2021-09-10 ENCOUNTER — Other Ambulatory Visit: Payer: Self-pay

## 2021-10-11 ENCOUNTER — Other Ambulatory Visit: Payer: Self-pay

## 2022-01-20 ENCOUNTER — Other Ambulatory Visit: Payer: Self-pay

## 2022-05-18 ENCOUNTER — Other Ambulatory Visit: Payer: Self-pay | Admitting: Family Medicine

## 2022-05-18 DIAGNOSIS — Z1231 Encounter for screening mammogram for malignant neoplasm of breast: Secondary | ICD-10-CM

## 2022-05-25 ENCOUNTER — Ambulatory Visit
Admission: RE | Admit: 2022-05-25 | Discharge: 2022-05-25 | Disposition: A | Discharge status: Home / Self Care | Payer: BC Managed Care – PPO | Source: Ambulatory Visit | Attending: Family Medicine | Admitting: Family Medicine

## 2022-05-25 DIAGNOSIS — Z1231 Encounter for screening mammogram for malignant neoplasm of breast: Secondary | ICD-10-CM | POA: Diagnosis not present

## 2023-07-31 ENCOUNTER — Other Ambulatory Visit: Payer: Self-pay | Admitting: Family Medicine

## 2023-07-31 DIAGNOSIS — Z1231 Encounter for screening mammogram for malignant neoplasm of breast: Secondary | ICD-10-CM

## 2023-09-04 ENCOUNTER — Ambulatory Visit
Admission: RE | Admit: 2023-09-04 | Discharge: 2023-09-04 | Disposition: A | Discharge status: Home / Self Care | Payer: BC Managed Care – PPO | Source: Ambulatory Visit | Attending: Family Medicine | Admitting: Family Medicine

## 2023-09-04 DIAGNOSIS — Z1231 Encounter for screening mammogram for malignant neoplasm of breast: Secondary | ICD-10-CM | POA: Diagnosis present
